# Patient Record
Sex: Male | Born: 1960 | Race: White | Hispanic: No | Marital: Married
Health system: Southern US, Community
[De-identification: ages and names within clinical notes are randomized; demographics above are authoritative.]

---

## 1997-08-09 ENCOUNTER — Other Ambulatory Visit: Admission: RE | Admit: 1997-08-09 | Discharge: 1997-08-09 | Payer: Self-pay | Admitting: Dermatology

## 2000-01-22 ENCOUNTER — Ambulatory Visit (HOSPITAL_COMMUNITY): Admission: RE | Admit: 2000-01-22 | Discharge: 2000-01-22 | Payer: Self-pay | Admitting: Gastroenterology

## 2003-12-18 ENCOUNTER — Emergency Department (HOSPITAL_COMMUNITY): Admission: EM | Admit: 2003-12-18 | Discharge: 2003-12-18 | Payer: Self-pay | Admitting: Emergency Medicine

## 2003-12-20 ENCOUNTER — Ambulatory Visit (HOSPITAL_COMMUNITY): Admission: RE | Admit: 2003-12-20 | Discharge: 2003-12-20 | Payer: Self-pay | Admitting: Cardiothoracic Surgery

## 2003-12-23 ENCOUNTER — Ambulatory Visit (HOSPITAL_COMMUNITY): Admission: RE | Admit: 2003-12-23 | Discharge: 2003-12-23 | Payer: Self-pay | Admitting: Cardiothoracic Surgery

## 2004-01-11 ENCOUNTER — Encounter: Admission: RE | Admit: 2004-01-11 | Discharge: 2004-01-11 | Payer: Self-pay | Admitting: Cardiothoracic Surgery

## 2004-01-13 ENCOUNTER — Ambulatory Visit (HOSPITAL_COMMUNITY)
Admission: RE | Admit: 2004-01-13 | Discharge: 2004-01-13 | Payer: Self-pay | Admitting: Thoracic Surgery (Cardiothoracic Vascular Surgery)

## 2006-07-31 ENCOUNTER — Encounter: Admission: RE | Admit: 2006-07-31 | Discharge: 2006-07-31 | Payer: Self-pay | Admitting: Family Medicine

## 2010-01-23 ENCOUNTER — Ambulatory Visit: Payer: Self-pay | Admitting: Sports Medicine

## 2010-01-23 DIAGNOSIS — M2170 Unequal limb length (acquired), unspecified site: Secondary | ICD-10-CM | POA: Insufficient documentation

## 2010-01-23 DIAGNOSIS — M722 Plantar fascial fibromatosis: Secondary | ICD-10-CM

## 2010-01-23 DIAGNOSIS — Q667 Congenital pes cavus, unspecified foot: Secondary | ICD-10-CM | POA: Insufficient documentation

## 2010-01-25 ENCOUNTER — Ambulatory Visit: Payer: Self-pay | Admitting: Sports Medicine

## 2010-05-30 NOTE — Assessment & Plan Note (Signed)
Summary: PLANTAR FASCIITIS,MC   Vital Signs:  Patient profile:   50 year old male Height:      73 inches Weight:      182 pounds BP sitting:   111 / 75  (right arm)  Vitals Entered By: Lillia Pauls CMA (January 23, 2010 10:53 AM)  History of Present Illness: 1. Plantar fasciitis:  Pt has been dealing with pain at the bottom of his right foot off and on for the past year.  He initially had problems about 12 years ago.  He had custom orthotics made and had done well up until last November.  At that point he had debilitating pain and even had to use a cane to get to work.  He has been doing the normal rehab stretching / strengthening exercises and periodically gets better.  He is an avid runner but has also been doing a lot of cross training.  He thinks that he has had 4 flare ups of the pain in the past year.  Most recently he had an episode last week.  He has been taking Ibuprofen, using Voltaren gel, doing stretches, and wearing a hard dorsiflexion boot.  All of these seem to help and as of today he denies any pain.  He is getting frustrated with the pain continuing to come back.  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  Enjoys running and working out.    Physical Exam  General:  Well appearing.  Appears in good physical shape. Msk:  Right foot:  Pes cavus with Morton's foot.  Some loss of transverse arch.  Drop of longitudinal arch with standing.  5th MTP early bunion changes.  Plantar fascia is nontender.  Unable to reproduce pain.  Left foot:  Pes cavus with Morton's foot.  More loss of transverse arch.  5th MTP early bunion changes.  Plantar fascia is nontender.  Shoulders:  anterior scars bilaterally from shoulder surgery.  Right shoulder appears lower.  Back:  Very minimal scoliosis  Gait:   Supination with push off  Running form:  fairly neutral   Extremities:  2cm leg length discrepancy, right > left Additional Exam:  U/S exam: -Right foot: thickened plantar fascia compared to the left. RT is 0.66 vs AT of only 0.44  No heel spurs or obvious tears -Left foot: thickened achilles tendon compared to the right.  Moderate sized calcified lesion near the insertion of the plantar fasciitis. both PF and AT are about 0.5 thick on left   Impression & Recommendations:  Problem # 1:  PLANTAR FASCIITIS, RIGHT (ICD-728.71) Assessment New  Contributing factors include: degree of physical activity, standing on his feet for up to 10 hours a day, right leg length discrepancy, supination with walking / running.  Will have patient come back for custom orthotics.  Advised to continue the Voltaren gel and Ibuprofen as needed.  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 Apply small amount to affected area twice a day  dispo: large tube Prescriptions: VOLTAREN 1 % GEL (DICLOFENAC SODIUM) Apply small amount to affected area twice a day  Dispo: large tube  #1 x 3   Entered by:   Angelena Sole MD   Authorized by:   Enid Baas MD   Signed by:   Angelena Sole MD on 01/23/2010   Method used:   Electronically to        [facility] [address] [phone]* (retail)       [address]       [place], [place]  [address]       Ph: [phone] or [phone]       Fax: [phone]   RxID:   [id number]

## 2010-05-30 NOTE — Assessment & Plan Note (Signed)
Summary: to see fields,orthotics,mc   History of Present Illness: [name] [name] returns for his chronic PF he wanted to move ahead to get custom orthotics quickly as he has tried [employer] forces technical [employer] treatments and has been faithful w these    Allergies: No Known Drug Allergies  Physical Exam  General:  see visit of 2 days prior as there is no change   Impression & Recommendations:  Problem # 1:  PLANTAR FASCIITIS, RIGHT (ICD-728.71)  Orders: Foot Orthosis ( Arch Strap/Heel Cup) [id number]) Orthotic Materials, each unit [id number]) Orthotic Materials, each unit [id number])  Patient was fitted for a standard, cushioned, semi-rigid orthotic.  The orthotic was heated and the patient stood on the orthotic blank positioned on the orthotic stand. The patient was positioned in subtalar neutral position and 10 degrees of ankle dorsiflexion in a weight bearing stance. After completion of molding a stable based was applied to the orthotic blank.   The blank was ground to a stable position for weight bearing. 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)  he needs to try to use orthotics in work shoes since he stands 9 to 10 hrs and in all sports shoes  left arch markedly high RT shows some collapse  Gait is neutralized once he is placed in orthotics  Orders: [employer], each unit [phone]) Orthotic Materials, each unit (L3002)  Problem # 3:  UNEQUAL LEG LENGTH (ICD-736.81)  this is minor and has limited effect on gait - 1 cm  will use some blue cushion on left and leave base slt thicker on dress orthotics  reck 6 to 8 wks to rescan the PF on RT  Orders: Orthotic [employer], each unit (G9562) Orthotic Materials, each unit (L3002)  Complete Medication List: 1)  Voltaren 1 % Gel  (Diclofenac sodium) .... Apply small amount to affected area twice a day  dispo: large tube

## 2010-07-10 ENCOUNTER — Ambulatory Visit: Payer: Self-pay | Admitting: Sports Medicine

## 2010-08-21 ENCOUNTER — Encounter: Payer: Self-pay | Admitting: *Deleted

## 2010-09-15 NOTE — Procedures (Signed)
[facility]  Patient:    [name], [name]                       MRN: [id number] Proc. Date: 01/22/00 Adm. Date:  [id number] Attending:  Louie Bun CC:         Lacy Duverney, M.D.   Procedure Report  PROCEDURE:  Colonoscopy.  INDICATION FOR PROCEDURE:  Occasional rectal bleeding and a family history of colon polyps.  DESCRIPTION OF PROCEDURE:  The patient was placed in the left lateral decubitus position and placed on the pulse monitor with continuous low flow oxygen delivered by nasal cannula. He was sedated with 80 mg IV Demerol and 10 mg IV Versed. The Olympus video colonoscope was inserted into the rectum and advanced to the cecum, confirmed by transillumination at McBurneys point and visualization of the ileocecal valve and appendiceal orifice. The prep was good. The cecum, ascending, transverse, descending and sigmoid colon all appeared normal with no masses, polyps, diverticula or other mucosal abnormalities. The rectum likewise appeared normal and retroflexed view of the anus revealed only small internal hemorrhoids. The colonoscope was then withdrawn and the patient returned to the recovery room in stable condition. The patient tolerated the procedure well and there were no immediate complications.  IMPRESSION:  Small internal hemorrhoids otherwise normal colonoscopy.  PLAN:  Repeat colonoscopy in 5-10 years or flexible sigmoidoscopy in 5 years. DD:  01/22/00 TD:  01/22/00 Job: [id number]

## 2012-05-23 ENCOUNTER — Other Ambulatory Visit: Payer: Self-pay | Admitting: Family Medicine

## 2012-05-23 DIAGNOSIS — M25519 Pain in unspecified shoulder: Secondary | ICD-10-CM

## 2012-05-23 DIAGNOSIS — M542 Cervicalgia: Secondary | ICD-10-CM

## 2012-06-20 ENCOUNTER — Other Ambulatory Visit: Payer: Self-pay | Admitting: Family Medicine

## 2012-06-21 ENCOUNTER — Ambulatory Visit
Admission: RE | Admit: 2012-06-21 | Discharge: 2012-06-21 | Disposition: A | Discharge status: Home / Self Care | Payer: PRIVATE HEALTH INSURANCE | Source: Ambulatory Visit | Attending: Family Medicine | Admitting: Family Medicine

## 2013-07-23 ENCOUNTER — Ambulatory Visit
Admission: RE | Admit: 2013-07-23 | Discharge: 2013-07-23 | Disposition: A | Discharge status: Home / Self Care | Payer: PRIVATE HEALTH INSURANCE | Source: Ambulatory Visit | Attending: Family Medicine | Admitting: Family Medicine

## 2013-07-23 ENCOUNTER — Other Ambulatory Visit: Payer: Self-pay | Admitting: Family Medicine

## 2013-07-23 DIAGNOSIS — Z77018 Contact with and (suspected) exposure to other hazardous metals: Secondary | ICD-10-CM

## 2017-09-10 ENCOUNTER — Other Ambulatory Visit: Payer: Self-pay | Admitting: Orthopedic Surgery

## 2017-09-10 DIAGNOSIS — S4352XA Sprain of left acromioclavicular joint, initial encounter: Secondary | ICD-10-CM

## 2017-09-10 DIAGNOSIS — M25512 Pain in left shoulder: Secondary | ICD-10-CM

## 2017-09-12 ENCOUNTER — Ambulatory Visit
Admission: RE | Admit: 2017-09-12 | Discharge: 2017-09-12 | Disposition: A | Discharge status: Home / Self Care | Payer: PRIVATE HEALTH INSURANCE | Source: Ambulatory Visit | Attending: Orthopedic Surgery | Admitting: Orthopedic Surgery

## 2017-09-12 DIAGNOSIS — M25512 Pain in left shoulder: Secondary | ICD-10-CM

## 2017-09-12 DIAGNOSIS — S4352XA Sprain of left acromioclavicular joint, initial encounter: Secondary | ICD-10-CM
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
